# Patient Record
Sex: Male | Born: 1995
Health system: Southern US, Community
[De-identification: ages and names within clinical notes are randomized; demographics above are authoritative.]

## PROBLEM LIST (undated history)

## (undated) DIAGNOSIS — S83429A Sprain of lateral collateral ligament of unspecified knee, initial encounter: Secondary | ICD-10-CM

## (undated) DIAGNOSIS — S83519A Sprain of anterior cruciate ligament of unspecified knee, initial encounter: Secondary | ICD-10-CM

## (undated) DIAGNOSIS — R040 Epistaxis: Secondary | ICD-10-CM

## (undated) DIAGNOSIS — S83419A Sprain of medial collateral ligament of unspecified knee, initial encounter: Secondary | ICD-10-CM

## (undated) DIAGNOSIS — R1310 Dysphagia, unspecified: Secondary | ICD-10-CM

## (undated) DIAGNOSIS — S83289A Other tear of lateral meniscus, current injury, unspecified knee, initial encounter: Secondary | ICD-10-CM

## (undated) DIAGNOSIS — R198 Other specified symptoms and signs involving the digestive system and abdomen: Secondary | ICD-10-CM

## (undated) HISTORY — PX: TYMPANOSTOMY TUBE PLACEMENT: SHX32

---
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
## 2016-01-15 ENCOUNTER — Emergency Department (HOSPITAL_COMMUNITY): Payer: Self-pay

## 2016-01-15 ENCOUNTER — Encounter (HOSPITAL_COMMUNITY): Payer: Self-pay | Admitting: Emergency Medicine

## 2016-01-15 ENCOUNTER — Emergency Department (HOSPITAL_COMMUNITY)
Admission: EM | Admit: 2016-01-15 | Discharge: 2016-01-16 | Disposition: A | Discharge status: Home / Self Care | Payer: Self-pay | Attending: Emergency Medicine | Admitting: Emergency Medicine

## 2016-01-15 DIAGNOSIS — R319 Hematuria, unspecified: Secondary | ICD-10-CM | POA: Insufficient documentation

## 2016-01-15 DIAGNOSIS — R109 Unspecified abdominal pain: Secondary | ICD-10-CM | POA: Insufficient documentation

## 2016-01-15 DIAGNOSIS — Z7722 Contact with and (suspected) exposure to environmental tobacco smoke (acute) (chronic): Secondary | ICD-10-CM | POA: Insufficient documentation

## 2016-01-15 LAB — COMPREHENSIVE METABOLIC PANEL
ALBUMIN: 4.7 g/dL (ref 3.5–5.0)
ALT: 31 U/L (ref 17–63)
ANION GAP: 7 (ref 5–15)
AST: 22 U/L (ref 15–41)
Alkaline Phosphatase: 51 U/L (ref 38–126)
BUN: 21 mg/dL — AB (ref 6–20)
CALCIUM: 8.9 mg/dL (ref 8.9–10.3)
CHLORIDE: 105 mmol/L (ref 101–111)
CO2: 25 mmol/L (ref 22–32)
CREATININE: 0.93 mg/dL (ref 0.61–1.24)
GFR calc non Af Amer: >60 mL/min (ref >60)
GLUCOSE: 90 mg/dL (ref 65–99)
POTASSIUM: 3.9 mmol/L (ref 3.5–5.1)
SODIUM: 137 mmol/L (ref 135–145)
TOTAL PROTEIN: 7.2 g/dL (ref 6.5–8.1)
Total Bilirubin: 1.7 mg/dL — ABNORMAL HIGH (ref 0.3–1.2)

## 2016-01-15 LAB — URINALYSIS, ROUTINE W REFLEX MICROSCOPIC
BILIRUBIN URINE: NEGATIVE
Glucose, UA: NEGATIVE mg/dL
KETONES UR: NEGATIVE mg/dL
Leukocytes, UA: NEGATIVE
NITRITE: NEGATIVE
PH: 8 (ref 5.0–8.0)
Protein, ur: NEGATIVE mg/dL
SPECIFIC GRAVITY, URINE: 1.02 (ref 1.005–1.030)

## 2016-01-15 LAB — CBC WITH DIFFERENTIAL/PLATELET
BASOS PCT: 0 %
Basophils Absolute: 0 10*3/uL (ref 0.0–0.1)
EOS ABS: 0.2 10*3/uL (ref 0.0–0.7)
EOS PCT: 3 %
HCT: 41.8 % (ref 39.0–52.0)
Hemoglobin: 14.8 g/dL (ref 13.0–17.0)
LYMPHS ABS: 2.9 10*3/uL (ref 0.7–4.0)
Lymphocytes Relative: 45 %
MCH: 29.7 pg (ref 26.0–34.0)
MCHC: 35.4 g/dL (ref 30.0–36.0)
MCV: 83.8 fL (ref 78.0–100.0)
MONOS PCT: 9 %
Monocytes Absolute: 0.6 10*3/uL (ref 0.1–1.0)
NEUTROS PCT: 43 %
Neutro Abs: 2.8 10*3/uL (ref 1.7–7.7)
PLATELETS: 158 10*3/uL (ref 150–400)
RBC: 4.99 MIL/uL (ref 4.22–5.81)
RDW: 12.8 % (ref 11.5–15.5)
WBC: 6.5 10*3/uL (ref 4.0–10.5)

## 2016-01-15 LAB — URINE MICROSCOPIC-ADD ON
Squamous Epithelial / LPF: NONE SEEN
WBC, UA: NONE SEEN WBC/hpf (ref 0–5)

## 2016-01-15 NOTE — ED Provider Notes (Signed)
CSN: [id number]     Arrival date & time 01/15/16  2038 History   First MD Initiated Contact with Patient 01/15/16 2214     Chief Complaint  Patient presents with  . Abdominal Pain     (Consider location/radiation/quality/duration/timing/severity/associated sxs/prior Treatment) The history is provided by the patient.   Stanley Hall is a 20 y.o. male with no significant past medical history presenting with sudden onset of right flank and lateral abdominal pain, described as sharp, stabbing and now less intense but still tender with a pressure sensation starting around 8 pm tonight.  He had no nausea, vomiting, diarrhea, but reports several episodes of urination since the symptoms began which briefly worsens his pain.  He denies hematuria, fevers, chills or back pain. He has had no medicines prior to arrival tonight.  His pain is currently better.    Past Medical History  Diagnosis Date  . Nosebleed     occasional  . ACL tear 09/2012    left  . Lateral collateral ligament sprain of knee 09/2012    left  . Medial collateral ligament sprain of knee 09/2012    left  . Difficulty swallowing pills     needs liquid pain med., per mother  . ACL (anterior cruciate ligament) tear 09/16/2012  . Tear of MCL (medial collateral ligament) of knee 09/16/2012  . Lateral meniscus tear 09/16/2012   Past Surgical History  Procedure Laterality Date  . Mouth surgery  2001    trauma roof of mouth   . Tonsillectomy  05/02/2004  . Tympanostomy tube placement    . Knee arthroscopy with anterior cruciate ligament (acl) repair with hamstring graft  09/16/2012    Procedure: KNEE ARTHROSCOPY WITH ANTERIOR CRUCIATE LIGAMENT (ACL) REPAIR WITH HAMSTRING GRAFT;  Surgeon: Eulas PostJoshua P Landau, MD;  Location: [facility];  Service: Orthopedics;  Laterality: Left;  Arthroscopy Left Knee with  Anterior Cruciate Ligament Repair, Partial Lateral Menisectomy, Medial Collateral Ligament Repair   Family  History  Problem Relation Age of Onset  . Hypertension Mother   . Diabetes Maternal Grandmother   . Hypertension Maternal Grandmother   . COPD Maternal Grandmother   . Asthma Maternal Grandmother   . Hypertension Paternal Grandmother   . Stroke Paternal Grandmother    Social History  Substance Use Topics  . Smoking status: Passive Smoke Exposure - Never Smoker  . Smokeless tobacco: Never Used     Comment: father smokes outside  . Alcohol Use: No    Review of Systems  Constitutional: Negative for fever.  HENT: Negative for congestion and sore throat.   Eyes: Negative.   Respiratory: Negative for chest tightness and shortness of breath.   Cardiovascular: Negative for chest pain.  Gastrointestinal: Positive for abdominal pain. Negative for nausea, vomiting, diarrhea and constipation.  Genitourinary: Positive for dysuria and flank pain. Negative for urgency and hematuria.  Musculoskeletal: Negative for joint swelling, arthralgias and neck pain.  Skin: Negative.  Negative for rash and wound.  Neurological: Negative for dizziness, weakness, light-headedness, numbness and headaches.  Psychiatric/Behavioral: Negative.       Allergies  Review of patient's allergies indicates no known allergies.  Home Medications   Prior to Admission medications   Medication Sig Start Date End Date Taking? Authorizing Provider  DiphenhydrAMINE HCl (ALLERGY MED PO) Take by mouth daily as needed.    Historical Provider, MD   BP 139/76 mmHg  Pulse 72  Temp(Src) 98 F (36.7 C) (Oral)  Resp 24  Ht   (1.778 m)  Wt 72.576 kg  BMI 22.96 kg/m2  SpO2 100% Physical Exam  Constitutional: He appears well-developed and well-nourished.  HENT:  Head: Normocephalic and atraumatic.  Eyes: Conjunctivae are normal.  Neck: Normal range of motion.  Cardiovascular: Normal rate, regular rhythm, normal heart sounds and intact distal pulses.   Pulmonary/Chest: Effort normal and breath sounds normal. He  has no wheezes.  Abdominal: Soft. Bowel sounds are normal. He exhibits no mass. There is no tenderness. There is no rebound and no guarding.  Pain over right lateral abdomen and flank but not worsened with palpation.  Musculoskeletal: Normal range of motion.  Neurological: He is alert.  Skin: Skin is warm and dry.  Psychiatric: He has a normal mood and affect.  Nursing note and vitals reviewed.   ED Course  Procedures (including critical care time) Labs Review Labs Reviewed  URINALYSIS, ROUTINE W REFLEX MICROSCOPIC (NOT AT [facility]) - Abnormal; Notable for the following:    Hgb urine dipstick TRACE (*)    All other components within normal limits  COMPREHENSIVE METABOLIC PANEL - Abnormal; Notable for the following:    BUN 21 (*)    Total Bilirubin 1.7 (*)    All other components within normal limits  URINE MICROSCOPIC-ADD ON - Abnormal; Notable for the following:    Bacteria, UA MANY (*)    All other components within normal limits  CBC WITH DIFFERENTIAL/PLATELET    Imaging Review No results found. I have personally reviewed and evaluated these images and lab results as part of my medical decision-making.   EKG Interpretation None      MDM   Final diagnoses:  Flank pain    Patients labs reviewed.  Radiological studies were viewed, interpreted and considered during the medical decision making and disposition process. I agree with radiologists reading.  Results were also discussed with patient. Urine cx ordered.  Pt with suspected passage of right ureteral stone.  Sx almost resolved at time of dc.  Pt deferred any pain management offers during ed visit. He was prescribed lortab elixer for prn use (states can't swallow pills). Advised recheck by pcp and/or return here for any worsened sx.  Pt discussed with Dr. Adriana Simas prior to dc home.     Burgess Amor, PA-C 01/16/16 1159  Donnetta Hutching, MD 01/18/16 [phone]

## 2016-01-15 NOTE — ED Notes (Signed)
Pt c/o abd pain on the right side.

## 2016-01-16 MED ORDER — ACETAMINOPHEN-CODEINE 120-12 MG/5ML PO SOLN: 12.5000 mL | ORAL | Status: DC | PRN

## 2016-01-16 NOTE — Discharge Instructions (Signed)
Flank Pain Flank pain refers to pain that is located on the side of the body between the upper abdomen and the back. The pain may occur over a short period of time (acute) or may be long-term or reoccurring (chronic). It may be mild or severe. Flank pain can be caused by many things. CAUSES  Some of the more common causes of flank pain include:  Muscle strains.   Muscle spasms.   A disease of your spine (vertebral disk disease).   A lung infection (pneumonia).   Fluid around your lungs (pulmonary edema).   A kidney infection.   Kidney stones.   A very painful skin rash caused by the chickenpox virus (shingles).   Gallbladder disease.  HOME CARE INSTRUCTIONS  Home care will depend on the cause of your pain. In general,  Rest as directed by your caregiver.  Drink enough fluids to keep your urine clear or pale yellow.  Only take over-the-counter or prescription medicines as directed by your caregiver. Some medicines may help relieve the pain.  Tell your caregiver about any changes in your pain.  Follow up with your caregiver as directed. SEEK IMMEDIATE MEDICAL CARE IF:   Your pain is not controlled with medicine.   You have new or worsening symptoms.  Your pain increases.   You have abdominal pain.   You have shortness of breath.   You have persistent nausea or vomiting.   You have swelling in your abdomen.   You feel faint or pass out.   You have blood in your urine.  You have a fever or persistent symptoms for more than 2-3 days.  You have a fever and your symptoms suddenly get worse. MAKE SURE YOU:   Understand these instructions.  Will watch your condition.  Will get help right away if you are not doing well or get worse.   This information is not intended to replace advice given to you by your health care provider. Make sure you discuss any questions you have with your health care provider.   Document Released: 12/10/2005 Document  Revised: 07/13/2012 Document Reviewed: 06/02/2012 Elsevier Interactive Patient Education [employer]2016 [employer]   As discussed I suspect you have passed a kidney stone tonight as there is no residual stone in your kidney or ureter, but your symptoms suggest this is what happened.  You may take the tylenol and codeine syrup prescribed for pain relief.  This will make you drowsy - do not drive within 4 hours of taking this medication.

## 2016-02-26 ENCOUNTER — Telehealth: Payer: Self-pay | Admitting: Family Medicine

## 2016-02-26 NOTE — Telephone Encounter (Signed)
A form was dropped off to be signed by the dr. Nat MathForm is in yellow folder in dr's office.

## 2016-02-27 NOTE — Telephone Encounter (Signed)
I certainly want to help this young man out. The form that they are asking for me to sign states that I have reviewed additional information but I do not see that information. Asked the patient does he have this additional information for me to review. Please do not lose or misplaced this form it is very important for his livelihood. Please also verify with the patient that he is doing well able to exercise lift and run without having any physical health problems. Please let the patient know that if he is having any physical health problems I would expect him to schedule an office visit to be seen this coming week. Nurse's-please note I will be out of town May 10 on word should therefore this needs to be handled relatively seen thank you

## 2016-02-28 NOTE — Telephone Encounter (Signed)
[facility]

## 2016-03-02 NOTE — Telephone Encounter (Signed)
Mom states no additional documents were given to her. i called [place] [employer] phone number that was on the phone. They were unsure of what documents were suppose to be attached. i faxed sandra the form so she could look at it and see what documents they were suppose to send.

## 2016-03-02 NOTE — Telephone Encounter (Signed)
[phone] mom's cell. Call on this number.

## 2016-03-03 NOTE — Telephone Encounter (Signed)
[employer] called mom and told her what forms she needed to bring. Mom states she will drop off forms today. Needs form by next Tuesday.

## 2016-03-03 NOTE — Telephone Encounter (Signed)
Pt states he does not have any sob or chest tightness. Notified forms ready for pickup.

## 2016-03-03 NOTE — Telephone Encounter (Signed)
Form in your folder

## 2016-03-03 NOTE — Telephone Encounter (Signed)
Nurse's-please discuss with the patient. I have not seen him in 2 years time. Verify that he is able to do standard physical exercise without chest tightness or breathing issues. 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 The form is completed. Obviously if he is having difficulty with exercise because of physical ailment he needs to be seen before being approved

## 2017-05-20 ENCOUNTER — Telehealth: Payer: Self-pay | Admitting: *Deleted

## 2017-05-20 NOTE — Telephone Encounter (Signed)
Shot record up front for pick up. Patient notified. 

## 2017-05-20 NOTE — Telephone Encounter (Signed)
Mom called for patient stating he is starting a new job on Monday and he needs his shot records by Monday. Please advise

## 2019-08-09 ENCOUNTER — Other Ambulatory Visit: Payer: Self-pay

## 2019-08-09 ENCOUNTER — Ambulatory Visit (INDEPENDENT_AMBULATORY_CARE_PROVIDER_SITE_OTHER): Payer: BC Managed Care – PPO | Admitting: Family Medicine

## 2019-08-09 DIAGNOSIS — K121 Other forms of stomatitis: Secondary | ICD-10-CM

## 2019-08-09 NOTE — Progress Notes (Signed)
   Subjective:    Patient ID: Stanley Hall, male    DOB: 03-12-1996, 23 y.o.   MRN: [id number]  HPISore on the inside of his mouth. Come up a few months ago. No pain. Tried canker sore medicine and it did not help.   Virtual Visit via Telephone Note  I connected with LENNARD CAPEK on 08/09/19 at 10:00 AM EDT by telephone and verified that I am speaking with the correct person using two identifiers.  Location: Patient: home Provider: office   I discussed the limitations, risks, security and privacy concerns of performing an evaluation and management service by telephone and the availability of in person appointments. I also discussed with the patient that there may be a patient responsible charge related to this service. The patient expressed understanding and agreed to proceed.   History of Present Illness:    Observations/Objective:   Assessment and Plan:   Follow Up Instructions:    I discussed the assessment and treatment plan with the patient. The patient was provided an opportunity to ask questions and all were answered. The patient agreed with the plan and demonstrated an understanding of the instructions.   The patient was advised to call back or seek an in-person evaluation if the symptoms worsen or if the condition fails to improve as anticipated.  I provided 15 minutes of non-face-to-face time during this encounter.        Review of Systems     Objective:   Physical Exam   We will to do physical exam via phone I did tell the patient is very important he come to be evaluated to make sure that this is not something worrisome    Assessment & Plan:  Because patient has an area in his lower gumline this needs to be seen in person therefore he will come in on Friday.  We will do an evaluation at that time

## 2019-08-11 ENCOUNTER — Encounter: Payer: Self-pay | Admitting: Family Medicine

## 2019-08-11 ENCOUNTER — Other Ambulatory Visit: Payer: Self-pay

## 2019-08-11 ENCOUNTER — Ambulatory Visit (INDEPENDENT_AMBULATORY_CARE_PROVIDER_SITE_OTHER): Payer: BC Managed Care – PPO | Admitting: Family Medicine

## 2019-08-11 VITALS — Temp 98.5°F | Wt 171.8 lb

## 2019-08-11 DIAGNOSIS — K130 Diseases of lips: Secondary | ICD-10-CM

## 2019-08-11 NOTE — Progress Notes (Signed)
   Subjective:    Patient ID: Stanley Hall, male    DOB: [birth year], 23 y.o.   MRN: [id number]  HPIsore in mouth that comes and goes since July.  Patient relates an area on his lower lip causing him trouble present for at least a few months starting to get a little bit better but not a lot he was dipping tobacco but he quit   Review of Systems  Constitutional: Negative for activity change, chills and fever.  HENT: Negative for congestion, ear pain and rhinorrhea.   Eyes: Negative for discharge.  Respiratory: Negative for cough and wheezing.   Cardiovascular: Negative for chest pain.  Gastrointestinal: Negative for nausea and vomiting.  Musculoskeletal: Negative for arthralgias.       Objective:   Physical Exam Vitals signs reviewed.  Cardiovascular:     Rate and Rhythm: Normal rate and regular rhythm.     Heart sounds: Normal heart sounds. No murmur.  Pulmonary:     Effort: Pulmonary effort is normal.     Breath sounds: Normal breath sounds.  Lymphadenopathy:     Cervical: No cervical adenopathy.  Neurological:     Mental Status: He is alert.  Psychiatric:        Behavior: Behavior normal.    Neck exam no adenopathy felt no nodules felt oral exam no masses felt except for what appears to be dermatofibroma versus cystic area on the lower lip  The gums appear normal no leukoplakia     Assessment & Plan:  Lip lesion-referral to ENT for further evaluation avoid all tobacco products

## 2019-08-14 NOTE — Addendum Note (Signed)
Addended by: Vicente Males on: 08/14/2019 05:01 PM   Modules accepted: Orders

## 2019-08-14 NOTE — Progress Notes (Signed)
ENT referral placed.

## 2019-10-04 ENCOUNTER — Other Ambulatory Visit: Payer: Self-pay

## 2019-10-04 ENCOUNTER — Ambulatory Visit (INDEPENDENT_AMBULATORY_CARE_PROVIDER_SITE_OTHER): Payer: BC Managed Care – PPO | Admitting: Family Medicine

## 2019-10-04 DIAGNOSIS — L502 Urticaria due to cold and heat: Secondary | ICD-10-CM | POA: Diagnosis not present

## 2019-10-04 NOTE — Progress Notes (Signed)
   Subjective:    Patient ID: DEO MEHRINGER, male    DOB: [birth year], 23 y.o.   MRN: [id number]  Rash This is a new problem. Pertinent negatives include no congestion, cough, fever, rhinorrhea or vomiting. (Pt states when he gets hot, his skin will get itchy and develop a rash. Anytime he gets warm (shower also) breaks out in red blotchy patches. Tried sensitive skin lotions. Started over the summer. Used to be every once in a while but not it is anytime he gets hot/warm )  Patient describes blotches that itch that sound consistent with urticaria it is induced by heat this been going on over the past couple years but worse this year it happens with hot showers with workouts and with summertime.  He has never had this problem before.  Denies any type of unusual breathing issues swallowing issues etc. Virtual Visit via Telephone Note  I connected with Paulo Fruit on 10/04/19 at 10:00 AM EST by telephone and verified that I am speaking with the correct person using two identifiers.  Location: Patient: home Provider: office   I discussed the limitations, risks, security and privacy concerns of performing an evaluation and management service by telephone and the availability of in person appointments. I also discussed with the patient that there may be a patient responsible charge related to this service. The patient expressed understanding and agreed to proceed.   History of Present Illness:    Observations/Objective:   Assessment and Plan:   Follow Up Instructions:    I discussed the assessment and treatment plan with the patient. The patient was provided an opportunity to ask questions and all were answered. The patient agreed with the plan and demonstrated an understanding of the instructions.   The patient was advised to call back or seek an in-person evaluation if the symptoms worsen or if the condition fails to improve as anticipated.  I provided 16 minutes of non-face-to-face  time during this encounter.   Vicente Males, LPN    Review of Systems  Constitutional: Negative for activity change, chills and fever.  HENT: Negative for congestion, ear pain and rhinorrhea.   Eyes: Negative for discharge.  Respiratory: Negative for cough and wheezing.   Cardiovascular: Negative for chest pain.  Gastrointestinal: Negative for nausea and vomiting.  Musculoskeletal: Negative for arthralgias.  Skin: Positive for rash. Negative for pallor.       Objective:   Physical Exam Today's visit was via telephone Physical exam was not possible for this visit    15 minutes was spent with patient today discussing healthcare issues which they came.  More than 50% of this visit-total duration of visit-was spent in counseling and coordination of care.  Please see diagnosis regarding the focus of this coordination and care     Assessment & Plan:  Heat induced urticaria It is felt that this is more than likely the trigger Minimize heat exposure May use Zyrtec once or twice daily on a regular basis if it causes drowsiness I recommend Claritin generic Benadryl as needed If this starts getting ridiculous at the level of problems notify us we will set up with an allergist for injection to try to help but currently this would be the best approach patient understands

## 2020-08-01 ENCOUNTER — Telehealth: Payer: Self-pay | Admitting: *Deleted

## 2020-08-01 NOTE — Telephone Encounter (Signed)
Patient's mom sent my chart message stating patient having some stomach issues and requesting an appt. Patient scheduled office visit 08/05/2020 at 2pm with Dr Lorin Picket

## 2020-08-05 ENCOUNTER — Other Ambulatory Visit: Payer: Self-pay

## 2020-08-05 ENCOUNTER — Ambulatory Visit (INDEPENDENT_AMBULATORY_CARE_PROVIDER_SITE_OTHER): Payer: BC Managed Care – PPO | Admitting: Family Medicine

## 2020-08-05 VITALS — BP 122/78 | Temp 98.0°F | Wt 177.2 lb

## 2020-08-05 DIAGNOSIS — R197 Diarrhea, unspecified: Secondary | ICD-10-CM

## 2020-08-05 NOTE — Progress Notes (Signed)
   Subjective:    Patient ID: Stanley Hall, male    DOB: 1996/02/27, 24 y.o.   MRN: [id number]  HPI Very nice gentleman Works as a IT sales professional Recently had onset of some abdominal discomfort and frequent loose stools Denied any blood or mucus in the stools Never had this problem before Denied any major changes in his diet Has not been on any antibiotics recently No camping or travel recently No other family members with this  Patient arrives with abdominal cramping and spells of diarrhea off and on for 2 weeks- tried Pepto with no success- doing some better the last few days- ? IBS  2 weeks ago- weds with some abd sx Ate then lkater that day with pain That fri sat sun with watery stools And v times 2  By last Sunday with less pain Able to eat But still with frequent loose stools No antibiotics No unusual water sources With pain will need to go right away sulphur smell  Tried pepto kaeopectate helped a lttle   Review of Systems  Constitutional: Negative for activity change, appetite change and fatigue.  HENT: Negative for congestion and rhinorrhea.   Respiratory: Negative for cough and shortness of breath.   Cardiovascular: Negative for chest pain and leg swelling.  Gastrointestinal: Positive for diarrhea. Negative for abdominal pain, nausea and vomiting.  Neurological: Negative for dizziness and headaches.  Psychiatric/Behavioral: Negative for agitation and behavioral problems.       Objective:   Physical Exam Vitals reviewed.  Constitutional:      General: He is not in acute distress. HENT:     Head: Normocephalic and atraumatic.  Eyes:     General:        Right eye: No discharge.        Left eye: No discharge.  Neck:     Trachea: No tracheal deviation.  Cardiovascular:     Rate and Rhythm: Normal rate and regular rhythm.     Heart sounds: Normal heart sounds. No murmur heard.   Pulmonary:     Effort: Pulmonary effort is normal. No respiratory distress.      Breath sounds: Normal breath sounds.  Lymphadenopathy:     Cervical: No cervical adenopathy.  Skin:    General: Skin is warm and dry.  Neurological:     Mental Status: He is alert.     Coordination: Coordination normal.  Psychiatric:        Behavior: Behavior normal.    Abdomen totally benign nontender no guarding or rebound Patient was concerned about gallbladder disease unlikely to be that based on his exam and history      Assessment & Plan:  Frequent loose stools with intermittent abdominal discomfort Initially was worse with the abdominal discomfort but now mainly just loose stools Will need to do some testing to rule out other possibilities Stool culture Stool calprotectin in order to try to rule out inflammatory bowel disease Very unlikely for this to be anything like cancer But if stool studies and lab work does not reveal cause and if symptoms persist next step would be GI referral and possibility of colonoscopy to do biopsies regarding possibility of colitis/ulcerative colitis Patient to keep [place] updated how he is doing Avoid fried foods fatty foods Ultrasound CAT scan not indicated currently

## 2020-08-06 LAB — LIPASE: Lipase: 41 U/L (ref 13–78)

## 2020-08-06 LAB — CBC WITH DIFFERENTIAL/PLATELET
Basophils Absolute: 0.1 10*3/uL (ref 0.0–0.2)
Basos: 1 %
EOS (ABSOLUTE): 0.3 10*3/uL (ref 0.0–0.4)
Eos: 3 %
Hematocrit: 44.9 % (ref 37.5–51.0)
Hemoglobin: 15.3 g/dL (ref 13.0–17.7)
Immature Grans (Abs): 0.1 10*3/uL (ref 0.0–0.1)
Immature Granulocytes: 1 %
Lymphocytes Absolute: 2.2 10*3/uL (ref 0.7–3.1)
Lymphs: 28 %
MCH: 28.6 pg (ref 26.6–33.0)
MCHC: 34.1 g/dL (ref 31.5–35.7)
MCV: 84 fL (ref 79–97)
Monocytes Absolute: 0.7 10*3/uL (ref 0.1–0.9)
Monocytes: 9 %
Neutrophils Absolute: 4.6 10*3/uL (ref 1.4–7.0)
Neutrophils: 58 %
Platelets: 227 10*3/uL (ref 150–450)
RBC: 5.35 x10E6/uL (ref 4.14–5.80)
RDW: 13.2 % (ref 11.6–15.4)
WBC: 8 10*3/uL (ref 3.4–10.8)

## 2020-08-06 LAB — HEPATIC FUNCTION PANEL
ALT: 102 IU/L — ABNORMAL HIGH (ref 0–44)
AST: 40 IU/L (ref 0–40)
Albumin: 4.6 g/dL (ref 4.1–5.2)
Alkaline Phosphatase: 58 IU/L (ref 44–121)
Bilirubin Total: 1.2 mg/dL (ref 0.0–1.2)
Bilirubin, Direct: 0.22 mg/dL (ref 0.00–0.40)
Total Protein: 6.5 g/dL (ref 6.0–8.5)

## 2020-08-06 LAB — TISSUE TRANSGLUTAMINASE, IGA: Transglutaminase IgA: <2 U/mL (ref 0–3)

## 2020-08-07 ENCOUNTER — Telehealth: Payer: Self-pay | Admitting: *Deleted

## 2020-08-07 NOTE — Telephone Encounter (Signed)
Please see result notes.  

## 2020-08-07 NOTE — Telephone Encounter (Signed)
Pt's mom called for lab results. I told mom we would call pt after dr scott signs off on

## 2020-08-08 NOTE — Telephone Encounter (Signed)
Mom not on DPR. Contacted patient with results. Pt verbalized understanding

## 2020-08-14 ENCOUNTER — Other Ambulatory Visit: Payer: Self-pay | Admitting: Family Medicine

## 2020-08-14 DIAGNOSIS — R197 Diarrhea, unspecified: Secondary | ICD-10-CM

## 2020-08-22 ENCOUNTER — Encounter: Payer: Self-pay | Admitting: *Deleted

## 2021-01-31 DIAGNOSIS — S134XXA Sprain of ligaments of cervical spine, initial encounter: Secondary | ICD-10-CM | POA: Diagnosis not present

## 2021-01-31 DIAGNOSIS — S335XXA Sprain of ligaments of lumbar spine, initial encounter: Secondary | ICD-10-CM | POA: Diagnosis not present

## 2021-01-31 DIAGNOSIS — M7551 Bursitis of right shoulder: Secondary | ICD-10-CM | POA: Diagnosis not present

## 2021-01-31 DIAGNOSIS — S233XXA Sprain of ligaments of thoracic spine, initial encounter: Secondary | ICD-10-CM | POA: Diagnosis not present

## 2021-02-21 DIAGNOSIS — S134XXA Sprain of ligaments of cervical spine, initial encounter: Secondary | ICD-10-CM | POA: Diagnosis not present

## 2021-02-21 DIAGNOSIS — M7551 Bursitis of right shoulder: Secondary | ICD-10-CM | POA: Diagnosis not present

## 2021-02-21 DIAGNOSIS — S335XXA Sprain of ligaments of lumbar spine, initial encounter: Secondary | ICD-10-CM | POA: Diagnosis not present

## 2021-02-21 DIAGNOSIS — S233XXA Sprain of ligaments of thoracic spine, initial encounter: Secondary | ICD-10-CM | POA: Diagnosis not present

## 2021-09-18 DIAGNOSIS — S335XXA Sprain of ligaments of lumbar spine, initial encounter: Secondary | ICD-10-CM | POA: Diagnosis not present

## 2021-09-18 DIAGNOSIS — M7551 Bursitis of right shoulder: Secondary | ICD-10-CM | POA: Diagnosis not present

## 2021-09-18 DIAGNOSIS — S233XXA Sprain of ligaments of thoracic spine, initial encounter: Secondary | ICD-10-CM | POA: Diagnosis not present

## 2021-09-18 DIAGNOSIS — S134XXA Sprain of ligaments of cervical spine, initial encounter: Secondary | ICD-10-CM | POA: Diagnosis not present

## 2021-11-18 DIAGNOSIS — S335XXA Sprain of ligaments of lumbar spine, initial encounter: Secondary | ICD-10-CM | POA: Diagnosis not present

## 2021-11-18 DIAGNOSIS — S233XXA Sprain of ligaments of thoracic spine, initial encounter: Secondary | ICD-10-CM | POA: Diagnosis not present

## 2021-11-18 DIAGNOSIS — S134XXA Sprain of ligaments of cervical spine, initial encounter: Secondary | ICD-10-CM | POA: Diagnosis not present

## 2021-11-18 DIAGNOSIS — M7551 Bursitis of right shoulder: Secondary | ICD-10-CM | POA: Diagnosis not present

## 2022-02-25 ENCOUNTER — Telehealth: Payer: Self-pay | Admitting: Orthopedic Surgery

## 2022-02-25 NOTE — Telephone Encounter (Signed)
Patient called wanting to schedule an appt he has been going to a chiropractor in [place] Dr. Magnus Sinning and it's not working.  He would like to come to our office and get a steroid shot.  ? ?Advised him we will need the office notes from Dr. Magnus Sinning office to review and schedule him an appt, gave him our fax number to fax the notes  ?

## 2022-03-02 DIAGNOSIS — M24811 Other specific joint derangements of right shoulder, not elsewhere classified: Secondary | ICD-10-CM | POA: Diagnosis not present

## 2022-05-08 ENCOUNTER — Other Ambulatory Visit: Payer: Self-pay | Admitting: Nurse Practitioner

## 2022-05-08 ENCOUNTER — Ambulatory Visit
Admission: RE | Admit: 2022-05-08 | Discharge: 2022-05-08 | Disposition: A | Discharge status: Home / Self Care | Payer: No Typology Code available for payment source | Source: Ambulatory Visit | Attending: Nurse Practitioner | Admitting: Nurse Practitioner

## 2022-05-08 DIAGNOSIS — Z021 Encounter for pre-employment examination: Secondary | ICD-10-CM

## 2022-11-25 ENCOUNTER — Ambulatory Visit: Payer: 59 | Admitting: Family Medicine

## 2022-11-25 VITALS — BP 100/61 | HR 56 | Temp 97.8°F | Ht 70.0 in | Wt 187.0 lb

## 2022-11-25 DIAGNOSIS — R809 Proteinuria, unspecified: Secondary | ICD-10-CM | POA: Diagnosis not present

## 2022-11-25 DIAGNOSIS — G4710 Hypersomnia, unspecified: Secondary | ICD-10-CM

## 2022-11-25 DIAGNOSIS — R5383 Other fatigue: Secondary | ICD-10-CM

## 2022-11-25 LAB — POCT URINALYSIS DIP (CLINITEK)
Bilirubin, UA: NEGATIVE
Blood, UA: NEGATIVE
Glucose, UA: NEGATIVE mg/dL
Ketones, POC UA: NEGATIVE mg/dL
Leukocytes, UA: NEGATIVE
Nitrite, UA: NEGATIVE
POC PROTEIN,UA: 30 — AB
Spec Grav, UA: >=1.03 — AB (ref 1.010–1.025)
Urobilinogen, UA: 0.2 E.U./dL
pH, UA: 5 (ref 5.0–8.0)

## 2022-11-25 NOTE — Progress Notes (Signed)
   Subjective:    Patient ID: Stanley Hall, male    DOB: October 24, 1996, 27 y.o.   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 Other fatigue It is hard to know if this is because he is working every single day or if it is because or could be a underlying medical issue He relates a lot of extreme fatigue tiredness feeling rundown low energy  - CBC with Differential - CMP14+EGFR - Testosterone - TSH + free T4 - POCT URINALYSIS DIP (CLINITEK) - Microalbumin/Creatinine Ratio, Urine  2. Hypersomnolence We will check lab work first but if lab work overall looks good I recommend referral to neurology for further evaluation for sleep disorder - CBC with Differential - CMP14+EGFR - Testosterone - TSH + free T4 - POCT URINALYSIS DIP (CLINITEK)  3. Proteinuria, unspecified type Minimal amount of protein in the urine we will go ahead and send a urine ratio - CBC with Differential - CMP14+EGFR - Testosterone - TSH + free T4 - POCT URINALYSIS DIP (CLINITEK) - Microalbumin/Creatinine Ratio, Urine

## 2022-11-26 LAB — CMP14+EGFR
ALT: 32 IU/L (ref 0–44)
AST: 19 IU/L (ref 0–40)
Albumin/Globulin Ratio: 2.3 — ABNORMAL HIGH (ref 1.2–2.2)
Albumin: 4.8 g/dL (ref 4.3–5.2)
Alkaline Phosphatase: 67 IU/L (ref 44–121)
BUN/Creatinine Ratio: 14 (ref 9–20)
BUN: 17 mg/dL (ref 6–20)
Bilirubin Total: 1.3 mg/dL — ABNORMAL HIGH (ref 0.0–1.2)
CO2: 23 mmol/L (ref 20–29)
Calcium: 9.6 mg/dL (ref 8.7–10.2)
Chloride: 104 mmol/L (ref 96–106)
Creatinine, Ser: 1.25 mg/dL (ref 0.76–1.27)
Globulin, Total: 2.1 g/dL (ref 1.5–4.5)
Glucose: 85 mg/dL (ref 70–99)
Potassium: 4.6 mmol/L (ref 3.5–5.2)
Sodium: 141 mmol/L (ref 134–144)
Total Protein: 6.9 g/dL (ref 6.0–8.5)
eGFR: 81 mL/min/{1.73_m2} (ref >59)

## 2022-11-26 LAB — CBC WITH DIFFERENTIAL/PLATELET
Basophils Absolute: 0 10*3/uL (ref 0.0–0.2)
Basos: 1 %
EOS (ABSOLUTE): 0.1 10*3/uL (ref 0.0–0.4)
Eos: 3 %
Hematocrit: 47.1 % (ref 37.5–51.0)
Hemoglobin: 15.8 g/dL (ref 13.0–17.7)
Immature Grans (Abs): 0 10*3/uL (ref 0.0–0.1)
Immature Granulocytes: 0 %
Lymphocytes Absolute: 1.9 10*3/uL (ref 0.7–3.1)
Lymphs: 42 %
MCH: 29.3 pg (ref 26.6–33.0)
MCHC: 33.5 g/dL (ref 31.5–35.7)
MCV: 87 fL (ref 79–97)
Monocytes Absolute: 0.4 10*3/uL (ref 0.1–0.9)
Monocytes: 9 %
Neutrophils Absolute: 2.1 10*3/uL (ref 1.4–7.0)
Neutrophils: 45 %
Platelets: 221 10*3/uL (ref 150–450)
RBC: 5.39 x10E6/uL (ref 4.14–5.80)
RDW: 12.6 % (ref 11.6–15.4)
WBC: 4.5 10*3/uL (ref 3.4–10.8)

## 2022-11-26 LAB — TESTOSTERONE: Testosterone: 459 ng/dL (ref 264–916)

## 2022-11-26 LAB — MICROALBUMIN / CREATININE URINE RATIO
Creatinine, Urine: 223.8 mg/dL
Microalb/Creat Ratio: 3 mg/g creat (ref 0–29)
Microalbumin, Urine: 5.7 ug/mL

## 2022-11-26 LAB — SPECIMEN STATUS REPORT

## 2022-11-26 LAB — TSH+FREE T4
Free T4: 1.4 ng/dL (ref 0.82–1.77)
TSH: 0.609 u[IU]/mL (ref 0.450–4.500)

## 2022-11-27 NOTE — Addendum Note (Signed)
Addended by: Dairl Ponder on: 11/27/2022 11:41 AM   Modules accepted: Orders

## 2022-12-31 ENCOUNTER — Encounter: Payer: Self-pay | Admitting: Radiology
